# Patient Record
Sex: Male | Born: 1950 | Race: White | Hispanic: No | Marital: Married | Smoking: Never smoker
Health system: Southern US, Community
[De-identification: ages and names within clinical notes are randomized; demographics above are authoritative.]

---

## 2005-08-04 ENCOUNTER — Ambulatory Visit: Payer: Self-pay | Admitting: Family Medicine

## 2005-08-07 ENCOUNTER — Ambulatory Visit: Payer: Self-pay | Admitting: Family Medicine

## 2005-08-31 ENCOUNTER — Ambulatory Visit: Payer: Self-pay | Admitting: Family Medicine

## 2020-05-09 ENCOUNTER — Inpatient Hospital Stay
Admission: EM | Admit: 2020-05-09 | Discharge: 2020-05-11 | DRG: 229 | Disposition: A | Discharge status: Home / Self Care | Payer: Medicare HMO | Attending: Internal Medicine | Admitting: Internal Medicine

## 2020-05-09 ENCOUNTER — Emergency Department: Payer: Medicare HMO

## 2020-05-09 ENCOUNTER — Other Ambulatory Visit: Payer: Self-pay

## 2020-05-09 ENCOUNTER — Encounter: Payer: Self-pay | Admitting: Emergency Medicine

## 2020-05-09 DIAGNOSIS — R55 Syncope and collapse: Secondary | ICD-10-CM | POA: Diagnosis not present

## 2020-05-09 DIAGNOSIS — R001 Bradycardia, unspecified: Secondary | ICD-10-CM | POA: Diagnosis present

## 2020-05-09 DIAGNOSIS — R030 Elevated blood-pressure reading, without diagnosis of hypertension: Secondary | ICD-10-CM | POA: Diagnosis present

## 2020-05-09 DIAGNOSIS — I441 Atrioventricular block, second degree: Secondary | ICD-10-CM | POA: Diagnosis present

## 2020-05-09 DIAGNOSIS — Z20822 Contact with and (suspected) exposure to covid-19: Secondary | ICD-10-CM | POA: Diagnosis present

## 2020-05-09 LAB — HIV ANTIBODY (ROUTINE TESTING W REFLEX): HIV Screen 4th Generation wRfx: NONREACTIVE

## 2020-05-09 LAB — URINALYSIS, COMPLETE (UACMP) WITH MICROSCOPIC
Bacteria, UA: NONE SEEN
Bilirubin Urine: NEGATIVE
Glucose, UA: NEGATIVE mg/dL
Hgb urine dipstick: NEGATIVE
Ketones, ur: NEGATIVE mg/dL
Leukocytes,Ua: NEGATIVE
Nitrite: NEGATIVE
Protein, ur: NEGATIVE mg/dL
Specific Gravity, Urine: 1.009 (ref 1.005–1.030)
Squamous Epithelial / HPF: NONE SEEN (ref 0–5)
pH: 8 (ref 5.0–8.0)

## 2020-05-09 LAB — COMPREHENSIVE METABOLIC PANEL
ALT: 40 U/L (ref 0–44)
AST: 27 U/L (ref 15–41)
Albumin: 3.9 g/dL (ref 3.5–5.0)
Alkaline Phosphatase: 75 U/L (ref 38–126)
Anion gap: 10 (ref 5–15)
BUN: 16 mg/dL (ref 8–23)
CO2: 23 mmol/L (ref 22–32)
Calcium: 9 mg/dL (ref 8.9–10.3)
Chloride: 105 mmol/L (ref 98–111)
Creatinine, Ser: 0.95 mg/dL (ref 0.61–1.24)
GFR, Estimated: >60 mL/min (ref >60)
Glucose, Bld: 174 mg/dL — ABNORMAL HIGH (ref 70–99)
Potassium: 4.3 mmol/L (ref 3.5–5.1)
Sodium: 138 mmol/L (ref 135–145)
Total Bilirubin: 0.9 mg/dL (ref 0.3–1.2)
Total Protein: 7.1 g/dL (ref 6.5–8.1)

## 2020-05-09 LAB — CBC
HCT: 44.6 % (ref 39.0–52.0)
Hemoglobin: 14.9 g/dL (ref 13.0–17.0)
MCH: 31.8 pg (ref 26.0–34.0)
MCHC: 33.4 g/dL (ref 30.0–36.0)
MCV: 95.3 fL (ref 80.0–100.0)
Platelets: 233 10*3/uL (ref 150–400)
RBC: 4.68 MIL/uL (ref 4.22–5.81)
RDW: 12.1 % (ref 11.5–15.5)
WBC: 9.3 10*3/uL (ref 4.0–10.5)
nRBC: 0 % (ref 0.0–0.2)

## 2020-05-09 LAB — PROTIME-INR
INR: 1 (ref 0.8–1.2)
Prothrombin Time: 13.3 seconds (ref 11.4–15.2)

## 2020-05-09 LAB — CBG MONITORING, ED: Glucose-Capillary: 158 mg/dL — ABNORMAL HIGH (ref 70–99)

## 2020-05-09 LAB — APTT: aPTT: 30 seconds (ref 24–36)

## 2020-05-09 LAB — TROPONIN I (HIGH SENSITIVITY)
Troponin I (High Sensitivity): 17 ng/L (ref <18)
Troponin I (High Sensitivity): 16 ng/L (ref <18)

## 2020-05-09 LAB — TSH: TSH: 1.73 u[IU]/mL (ref 0.350–4.500)

## 2020-05-09 MED ORDER — SODIUM CHLORIDE 0.9 % IV SOLN: INTRAVENOUS | Status: DC

## 2020-05-09 MED ORDER — ADULT MULTIVITAMIN W/MINERALS CH
1.0000 | ORAL_TABLET | Freq: Every day | ORAL | Status: DC
  Administered 2020-05-11: 1 via ORAL
  Filled 2020-05-09: qty 1

## 2020-05-09 MED ORDER — CEFAZOLIN SODIUM-DEXTROSE 2-4 GM/100ML-% IV SOLN
2.0000 g | INTRAVENOUS | Status: DC
  Filled 2020-05-09: qty 100

## 2020-05-09 MED ORDER — ONDANSETRON HCL 4 MG/2ML IJ SOLN: 4.0000 mg | Freq: Four times a day (QID) | INTRAMUSCULAR | Status: DC | PRN

## 2020-05-09 MED ORDER — ONDANSETRON HCL 4 MG PO TABS: 4.0000 mg | ORAL_TABLET | Freq: Four times a day (QID) | ORAL | Status: DC | PRN

## 2020-05-09 MED ORDER — SODIUM CHLORIDE 0.9% FLUSH
3.0000 mL | Freq: Two times a day (BID) | INTRAVENOUS | Status: DC
  Administered 2020-05-09 – 2020-05-11 (×3): 3 mL via INTRAVENOUS

## 2020-05-09 MED ORDER — SODIUM CHLORIDE 0.45 % IV SOLN: INTRAVENOUS | Status: DC

## 2020-05-09 MED ORDER — ACETAMINOPHEN 325 MG PO TABS: 650.0000 mg | ORAL_TABLET | Freq: Four times a day (QID) | ORAL | Status: DC | PRN

## 2020-05-09 NOTE — H&P (Signed)
History and Physical    Tanner Howard [id number]:[id number] DOB: 17-Jul-1950 DOA: 05/09/2020  PCP: Pcp, No   Patient coming from: Home  I have personally briefly reviewed patient's old medical records in [facility]  Chief Complaint: " I passed out"  HPI: Tanner Howard is a 70 y.o. male with no significant past medical history presents to the ER for evaluation of a syncopal episode that happened 3 days prior to his admission.  Patient states that he was working in his garden when he suddenly felt very dizzy and lightheaded.  He woke up and found himself on the ground.  He thinks he may have hit his head because he felt some soreness on his scalp for about 24 hours after.  He states that since then he has had intermittent episodes of dizziness and lightheadedness and just walking across the room he feels like he is going to pass out.  His symptoms are associated with shortness of breath but he denies having any chest pain.. Patient states that he has had symptoms intermittently since February of this year. He denies having any orthopnea, no lower extremity swelling, no nausea, no vomiting, no abdominal pain, no changes in his bowel habits, no headache, no blurred vision, no focal deficits, no urinary symptoms, no cough or fever, no chills. Labs show sodium 138, potassium 4.3, chloride 105, bicarb 23, glucose 174, BUN 16, creatinine 0.95, calcium 9.0, alkaline phosphatase 75, albumin 3.9, AST 27, ALT 40, total protein 7.1, troponin 17, white count 9.3, hemoglobin 14.9, hematocrit 44.6, MCV 95.3, RDW 12.1, platelet 233, PT 13.3, INR 1.0 Patient SARS coronavirus 2 point-of-care testing is still pending Chest x-ray reviewed by me shows no acute cardiopulmonary disease. Twelve-lead reviewed by me shows a type II second-degree heart block.    ED Course: Patient is a 70 year old male who presents to the ER for evaluation following a syncopal episode 3 days prior to his admission.  He continues to have  intermittent episodes of dizziness and lightheadedness and is unable to ambulate across the room due to his symptoms.  Twelve-lead EKG shows second-degree heart block.  Patient has been seen by cardiology and is scheduled for pacemaker in a.m.  He will be admitted to the hospital for further evaluation.   Review of Systems: As per HPI otherwise all other systems reviewed and negative.    History reviewed. No pertinent past medical history.  History reviewed. No pertinent surgical history.   reports that he has never smoked. He has never used smokeless tobacco. He reports current alcohol use. He reports previous drug use.  No Known Allergies  Family History  Family history unknown: Yes      Prior to Admission medications   Medication Sig Start Date End Date Taking? Authorizing Provider  acetaminophen (TYLENOL) 325 MG tablet Take 650 mg by mouth every 6 (six) hours as needed.   Yes [provider]  ibuprofen (ADVIL) 200 MG tablet Take 200 mg by mouth every 6 (six) hours as needed.   Yes [provider]  Multiple Vitamin (MULTIVITAMIN WITH MINERALS) TABS tablet Take 1 tablet by mouth daily.   Yes [provider]    Physical Exam: Vitals:   05/09/20 1007 05/09/20 1013 05/09/20 1030 05/09/20 1100  BP: (!) 156/76  (!) 160/62 (!) 148/73  Pulse: (!) 31  (!) 40 (!) 42  Resp: 18  16 20   Temp:  98.3 F (36.8 C)    TempSrc:  Oral    SpO2: 100%  100% 98%  Weight:      Height:         Vitals:   05/09/20 1007 05/09/20 1013 05/09/20 1030 05/09/20 1100  BP: (!) 156/76  (!) 160/62 (!) 148/73  Pulse: (!) 31  (!) 40 (!) 42  Resp: 18  16 20   Temp:  98.3 F (36.8 C)    TempSrc:  Oral    SpO2: 100%  100% 98%  Weight:      Height:          Constitutional: Alert and oriented x 3 . Not in any apparent distress HEENT:      Head: Normocephalic and atraumatic.         Eyes: PERLA, EOMI, Conjunctivae are normal. Sclera is non-icteric.       Mouth/Throat:  Mucous membranes are moist.       Neck: Supple with no signs of meningismus. Cardiovascular:  Bradycardic. No murmurs, gallops, or rubs. 2+ symmetrical distal pulses are present . No JVD. No LE edema Respiratory: Respiratory effort normal .Lungs sounds clear bilaterally. No wheezes, crackles, or rhonchi.  Gastrointestinal: Soft, non tender, and non distended with positive bowel sounds.  Genitourinary: No CVA tenderness. Musculoskeletal: Nontender with normal range of motion in all extremities. No cyanosis, or erythema of extremities. Neurologic:  Face is symmetric. Moving all extremities. No gross focal neurologic deficits . Skin: Skin is warm, dry.  No rash or ulcers Psychiatric: Mood and affect are normal   Labs on Admission: I have personally reviewed following labs and imaging studies  CBC: Recent Labs  Lab 05/09/20 1009  WBC 9.3  HGB 14.9  HCT 44.6  MCV 95.3  PLT 233   Basic Metabolic Panel: Recent Labs  Lab 05/09/20 1009  NA 138  K 4.3  CL 105  CO2 23  GLUCOSE 174*  BUN 16  CREATININE 0.95  CALCIUM 9.0   GFR: Estimated Creatinine Clearance: 77.9 mL/min (by C-G formula based on SCr of 0.95 mg/dL). Liver Function Tests: Recent Labs  Lab 05/09/20 1009  AST 27  ALT 40  ALKPHOS 75  BILITOT 0.9  PROT 7.1  ALBUMIN 3.9   No results for input(s): LIPASE, AMYLASE in the last 168 hours. No results for input(s): AMMONIA in the last 168 hours. Coagulation Profile: Recent Labs  Lab 05/09/20 1009  INR 1.0   Cardiac Enzymes: No results for input(s): CKTOTAL, CKMB, CKMBINDEX, TROPONINI in the last 168 hours. BNP (last 3 results) No results for input(s): PROBNP in the last 8760 hours. HbA1C: No results for input(s): HGBA1C in the last 72 hours. CBG: Recent Labs  Lab 05/09/20 1020  GLUCAP 158*   Lipid Profile: No results for input(s): CHOL, HDL, LDLCALC, TRIG, CHOLHDL, LDLDIRECT in the last 72 hours. Thyroid Function Tests: No results for input(s): TSH,  T4TOTAL, FREET4, T3FREE, THYROIDAB in the last 72 hours. Anemia Panel: No results for input(s): VITAMINB12, FOLATE, FERRITIN, TIBC, IRON, RETICCTPCT in the last 72 hours. Urine analysis: No results found for: COLORURINE, APPEARANCEUR, LABSPEC, PHURINE, GLUCOSEU, HGBUR, BILIRUBINUR, KETONESUR, PROTEINUR, UROBILINOGEN, NITRITE, LEUKOCYTESUR  Radiological Exams on Admission: DG Chest Port 1 View  Result Date: 05/09/2020 CLINICAL DATA:  Weakness, syncopal episode. EXAM: PORTABLE CHEST 1 VIEW COMPARISON:  None. FINDINGS: Borderline cardiomegaly. Pacer pad overlies the RIGHT chest coarse lung markings bilaterally. No pleural effusion or pneumothorax is seen. Osseous structures about the chest are unremarkable. IMPRESSION: 1. No active disease. No evidence of pneumonia or pulmonary edema. 2. Probable chronic interstitial lung disease and/or chronic  bronchitic change. 3. Borderline cardiomegaly. Electronically Signed   By: Bary Richard M.D.   On: 05/09/2020 11:06     Assessment/Plan Active Problems:   Symptomatic bradycardia   Syncope and collapse    Symptomatic bradycardia Patient presents to the ER for evaluation following a syncopal episode 3 days prior to his admission as well as intermittent episodes of dizziness and lightheadedness. Patient noted to be bradycardic with heart rate in the 30s and twelve-lead EKG shows a type II second-degree AV block Appreciate cardiology consult, plan is for pacemaker insertion in a.m. Obtain TSH levels Obtain 2D echocardiogram to assess  DVT prophylaxis: SCD Code Status: full code Family Communication: Greater than 50% spent discussing patient's condition and plan of care with him and his son at the bedside.  He lists his son and wife as his healthcare power of attorney and also states that he is a Jehovah witness and will not receive any blood products which include red blood cells, platelets, plasma.  CODE STATUS was discussed and he is a full  code. Disposition Plan: Back to previous home environment Consults called: Cardiology Status: At the time of admission, it appears that the appropriate admission status for this patient is inpatient. This is judged to be reasonable and necessary in order to provide the required intensity of service to ensure the patient's safety given the presenting symptoms, physical exam findings, and initial radiographic and laboratory data in the context of the comorbid conditions. Patient requires inpatient status due to high intensity of service, high risk for further deterioration and high frequency of surveillance required    Dinorah Masullo MD [facility]     05/09/2020, 12:00 PM

## 2020-05-09 NOTE — ED Notes (Signed)
X-ray at bedside

## 2020-05-09 NOTE — ED Provider Notes (Signed)
[facility] Emergency Department Provider Note   ____________________________________________    I have reviewed the triage vital signs and the nursing notes.   HISTORY  Chief Complaint     HPI Tanner Howard is a 70 y.o. male who presents after a syncopal episode several days ago and lightheadedness since then.  Patient reports on Thursday he was working in his garden, he reports he passed out.  Since then he feels lightheaded even when "just walking across the room ".  He denies chest pain.  No significant shortness of breath.  Feels well at rest.  He has never had this before.  No new medications.  Does not take any medications besides vitamins.  History reviewed. No pertinent past medical history.  There are no problems to display for this patient.   History reviewed. No pertinent surgical history.  Prior to Admission medications   Not on File     Allergies Patient has no known allergies.  No family history on file.  Social History Social History   Tobacco Use  . Smoking status: Never Smoker  . Smokeless tobacco: Never Used  Substance Use Topics  . Alcohol use: Yes    Comment: social  . Drug use: Not Currently    Review of Systems  Constitutional: No fever/chills Eyes: No visual changes.  ENT: No sore throat. Cardiovascular: As above Respiratory: Denies shortness of breath. Gastrointestinal: No abdominal pain.  No nausea, no vomiting.   Genitourinary: Negative for dysuria. Musculoskeletal: Negative for back pain. Skin: Negative for rash. Neurological: Negative for headaches or weakness   ____________________________________________   PHYSICAL EXAM:  VITAL SIGNS: ED Triage Vitals  Enc Vitals Group     BP 05/09/20 1007 (!) 156/76     Pulse Rate 05/09/20 1007 (!) 31     Resp 05/09/20 1007 18     Temp 05/09/20 1013 98.3 F (36.8 C)     Temp Source 05/09/20 1013 Oral     SpO2 05/09/20 1007 100 %     Weight 05/09/20  1002 79.4 kg (175 lb)     Height 05/09/20 1002 1.778 m (5\' 10" )     Head Circumference --      Peak Flow --      Pain Score 05/09/20 1002 0     Pain Loc --      Pain Edu? --      Excl. in GC? --     Constitutional: Alert and oriented.   Nose: No congestion/rhinnorhea. Mouth/Throat: Mucous membranes are moist.    Cardiovascular: Bradycardia, regular rhythm. Grossly normal heart sounds.  Good peripheral circulation. Respiratory: Normal respiratory effort.  No retractions. Lungs CTAB. Gastrointestinal: Soft and nontender. No distention.  No CVA tenderness.  Musculoskeletal: No lower extremity tenderness nor edema.  Warm and well perfused Neurologic:  Normal speech and language. No gross focal neurologic deficits are appreciated.  Skin:  Skin is warm, dry and intact. No rash noted. Psychiatric: Mood and affect are normal. Speech and behavior are normal.  ____________________________________________   LABS (all labs ordered are listed, but only abnormal results are displayed)  Labs Reviewed  COMPREHENSIVE METABOLIC PANEL - Abnormal; Notable for the following components:      Result Value   Glucose, Bld 174 (*)    All other components within normal limits  CBG MONITORING, ED - Abnormal; Notable for the following components:   Glucose-Capillary 158 (*)    All other components within normal limits  SARS CORONAVIRUS 2 (  TAT 6-24 HRS)  CBC  APTT  PROTIME-INR  URINALYSIS, COMPLETE (UACMP) WITH MICROSCOPIC  TROPONIN I (HIGH SENSITIVITY)   ____________________________________________  EKG  ED ECG REPORT I, Jene Every, the attending physician, personally viewed and interpreted this ECG.  Date: 05/09/2020  Rate: 30 Rhythm: A-V dissociation QRS Axis: normal Intervals: Abnormal ST/T Wave abnormalities: Mild ST depression Narrative Interpretation: Third-degree AV block  ____________________________________________  RADIOLOGY  Chest x-ray viewed by me, no acute  abnormality ____________________________________________   PROCEDURES  Procedure(s) performed: No  Procedures   Critical Care performed: yes  CRITICAL CARE Performed by: Jene Every   Total critical care time: 30 minutes  Critical care time was exclusive of separately billable procedures and treating other patients.  Critical care was necessary to treat or prevent imminent or life-threatening deterioration.  Critical care was time spent personally by me on the following activities: development of treatment plan with patient and/or surrogate as well as nursing, discussions with consultants, evaluation of patient's response to treatment, examination of patient, obtaining history from patient or surrogate, ordering and performing treatments and interventions, ordering and review of laboratory studies, ordering and review of radiographic studies, pulse oximetry and re-evaluation of patient's condition.  ____________________________________________   INITIAL IMPRESSION / ASSESSMENT AND PLAN / ED COURSE  Pertinent labs & imaging results that were available during my care of the patient were reviewed by me and considered in my medical decision making (see chart for details).  Patient presents with lightheadedness for several days after a syncopal episode.  Noted to be significantly bradycardic on exam, heart rate in the 30s, EKG confirms third-degree AV block.  Blood pressure is reassuring.  Patient is not on beta blocking agents or calcium channel blockers  Discussed with Dr. Darrold Junker of cardiology, he will see the patient to determine temporary wire/pacemaker needs.  CBC is normal PTT INR normal, pending troponin.  Will admit to the hospitalist team    ____________________________________________   FINAL CLINICAL IMPRESSION(S) / ED DIAGNOSES  Final diagnoses:  Second degree heart block  Bradycardia        Note:  This document was prepared using Dragon voice  recognition software and may include unintentional dictation errors.   Jene Every, MD 05/09/20 1104

## 2020-05-09 NOTE — ED Notes (Signed)
EDP Kinner called to bedside to evaluate pt due to heart rate. Zoll pads placed on pt.

## 2020-05-09 NOTE — ED Triage Notes (Signed)
Pt to ED via POV c/o syncopal episode on Thursday and shortness of breath. Pt states that since then he has been feeling lightheaded and like he can't catch his breath. Pt is able to speak in complete sentences at this time and is in NAD.

## 2020-05-09 NOTE — Consult Note (Signed)
[facility] Cardiology  CARDIOLOGY CONSULT NOTE  Patient ID: Tanner Howard MRN: [id number] DOB/AGE: October 26, 1950 71 y.o.  Admit date: 05/09/2020 Referring Physician [facility] Primary Physician  Primary Cardiologist  Reason for Consultation Mobitz 2 second-degree AV block  HPI: 70 year old gentleman referred for syncope with bradycardia secondary to Mobitz 2 second-degree AV block.  The patient reports 02/2020 he experienced episodes of lightheadedness which lasted 1 to 2 weeks.  He was in his usual state of health until 05/07/2020 when he experienced brief loss of consciousness while he was tilling in the garden.  The patient reports he did not seek medical attention.  He remained at home, relaxed, sitting on the couch without further activity or presyncopal symptoms.  On 05/08/2020 he resumed his usual activity, cooking steaks on the grill without presyncope or syncope, but did experience torsional dyspnea without chest pain.  Symptoms persisted, and he presented to [facility] ED earlier today where he was noted to be bradycardic.  ECG and telemetry revealed intermittent breaks to second-degree AV block with heart rates in the 40s.  Patient remains hemodynamically stable.  Admission labs were unremarkable.  High-sensitivity troponin was borderline elevated to 17 in the absence of chest pain.  ECG reveals 2-1 AV conduction with right bundle branch block, with intermittent blocked PACs, and sinus rhythm at a rate of 88 bpm.  Review of systems complete and found to be negative unless listed above     History reviewed. No pertinent past medical history.  History reviewed. No pertinent surgical history.  (Not in a hospital admission)  Social History   Socioeconomic History  . Marital status: Married    Spouse name: Not on file  . Number of children: Not on file  . Years of education: Not on file  . Highest education level: Not on file  Occupational History  . Not on file  Tobacco Use  . Smoking status: Never  Smoker  . Smokeless tobacco: Never Used  Substance and Sexual Activity  . Alcohol use: Yes    Comment: social  . Drug use: Not Currently  . Sexual activity: Not on file  Other Topics Concern  . Not on file  Social History Narrative  . Not on file   Social Determinants of Health   Financial Resource Strain: Not on file  Food Insecurity: Not on file  Transportation Needs: Not on file  Physical Activity: Not on file  Stress: Not on file  Social Connections: Not on file  Intimate Partner Violence: Not on file    No family history on file.    Review of systems complete and found to be negative unless listed above      PHYSICAL EXAM  General: Well developed, well nourished, in no acute distress HEENT:  Normocephalic and atramatic Neck:  No JVD.  Lungs: Clear bilaterally to auscultation and percussion. Heart: HRRR . Normal S1 and S2 without gallops or murmurs.  Abdomen: Bowel sounds are positive, abdomen soft and non-tender  Msk:  Back normal, normal gait. Normal strength and tone for age. Extremities: No clubbing, cyanosis or edema.   Neuro: Alert and oriented X 3. Psych:  Good affect, responds appropriately  Labs:   Lab Results  Component Value Date   WBC 9.3 05/09/2020   HGB 14.9 05/09/2020   HCT 44.6 05/09/2020   MCV 95.3 05/09/2020   PLT 233 05/09/2020    Recent Labs  Lab 05/09/20 1009  NA 138  K 4.3  CL 105  CO2 23  BUN 16  CREATININE 0.95  CALCIUM 9.0  PROT 7.1  BILITOT 0.9  ALKPHOS 75  ALT 40  AST 27  GLUCOSE 174*   No results found for: CKTOTAL, CKMB, CKMBINDEX, TROPONINI No results found for: CHOL No results found for: HDL No results found for: LDLCALC No results found for: TRIG No results found for: CHOLHDL No results found for: LDLDIRECT    Radiology: [facility] Chest Port 1 View  Result Date: 05/09/2020 CLINICAL DATA:  Weakness, syncopal episode. EXAM: PORTABLE CHEST 1 VIEW COMPARISON:  None. FINDINGS: Borderline cardiomegaly. Pacer pad  overlies the RIGHT chest coarse lung markings bilaterally. No pleural effusion or pneumothorax is seen. Osseous structures about the chest are unremarkable. IMPRESSION: 1. No active disease. No evidence of pneumonia or pulmonary edema. 2. Probable chronic interstitial lung disease and/or chronic bronchitic change. 3. Borderline cardiomegaly. Electronically Signed   By: Bary Richard M.D.   On: 05/09/2020 11:06    EKG: Mobitz 2 second-degree AV block  ASSESSMENT AND PLAN:   1.  Syncope, secondary to intermittent Mobitz 2 second-degree AV block, clinically and hemodynamically stable  Recommendations  1.  Defer temporary transvenous pacemaker at this time 2.  Proceed with Micra AV leadless pacemaker in a.m.  We discussed the risk, benefits and alternatives of traditional dual-chamber pacemaker versus Micra AV leadless pacemaker, and the patient and patient's son agree with proceeding with Micra AV leadless pacemaker scheduled for the a.m.  Signed: Marcina Millard MD,PhD, [facility] 05/09/2020, 11:35 AM

## 2020-05-10 ENCOUNTER — Inpatient Hospital Stay
Admit: 2020-05-10 | Discharge: 2020-05-10 | Disposition: A | Discharge status: Home / Self Care | Payer: Medicare HMO | Attending: Internal Medicine | Admitting: Internal Medicine

## 2020-05-10 ENCOUNTER — Encounter: Payer: Self-pay | Admitting: Internal Medicine

## 2020-05-10 ENCOUNTER — Encounter
Admission: EM | Disposition: A | Discharge status: Home / Self Care | Payer: Self-pay | Source: Home / Self Care | Attending: Internal Medicine

## 2020-05-10 DIAGNOSIS — R030 Elevated blood-pressure reading, without diagnosis of hypertension: Secondary | ICD-10-CM

## 2020-05-10 DIAGNOSIS — I441 Atrioventricular block, second degree: Principal | ICD-10-CM

## 2020-05-10 HISTORY — PX: PACEMAKER LEADLESS INSERTION: EP1219

## 2020-05-10 LAB — BASIC METABOLIC PANEL
Anion gap: 9 (ref 5–15)
BUN: 13 mg/dL (ref 8–23)
CO2: 25 mmol/L (ref 22–32)
Calcium: 8.8 mg/dL — ABNORMAL LOW (ref 8.9–10.3)
Chloride: 104 mmol/L (ref 98–111)
Creatinine, Ser: 0.89 mg/dL (ref 0.61–1.24)
GFR, Estimated: >60 mL/min (ref >60)
Glucose, Bld: 120 mg/dL — ABNORMAL HIGH (ref 70–99)
Potassium: 4 mmol/L (ref 3.5–5.1)
Sodium: 138 mmol/L (ref 135–145)

## 2020-05-10 LAB — CBC
HCT: 42.6 % (ref 39.0–52.0)
Hemoglobin: 14.8 g/dL (ref 13.0–17.0)
MCH: 32.4 pg (ref 26.0–34.0)
MCHC: 34.7 g/dL (ref 30.0–36.0)
MCV: 93.2 fL (ref 80.0–100.0)
Platelets: 228 10*3/uL (ref 150–400)
RBC: 4.57 MIL/uL (ref 4.22–5.81)
RDW: 11.9 % (ref 11.5–15.5)
WBC: 5.3 10*3/uL (ref 4.0–10.5)
nRBC: 0 % (ref 0.0–0.2)

## 2020-05-10 LAB — SURGICAL PCR SCREEN
MRSA, PCR: NEGATIVE
Staphylococcus aureus: POSITIVE — AB

## 2020-05-10 LAB — SARS CORONAVIRUS 2 (TAT 6-24 HRS): SARS Coronavirus 2: NEGATIVE

## 2020-05-10 SURGERY — PACEMAKER LEADLESS INSERTION
Anesthesia: Moderate Sedation

## 2020-05-10 MED ORDER — ACETAMINOPHEN 325 MG PO TABS: 650.0000 mg | ORAL_TABLET | ORAL | Status: DC | PRN

## 2020-05-10 MED ORDER — HEPARIN (PORCINE) IN NACL 1000-0.9 UT/500ML-% IV SOLN
INTRAVENOUS | Status: AC
  Filled 2020-05-10: qty 1000

## 2020-05-10 MED ORDER — FENTANYL CITRATE (PF) 100 MCG/2ML IJ SOLN
INTRAMUSCULAR | Status: DC | PRN
  Administered 2020-05-10: 50 ug via INTRAVENOUS

## 2020-05-10 MED ORDER — HEPARIN (PORCINE) IN NACL 2000-0.9 UNIT/L-% IV SOLN
INTRAVENOUS | Status: DC | PRN
  Administered 2020-05-10: 1000 mL

## 2020-05-10 MED ORDER — HEPARIN SODIUM (PORCINE) 1000 UNIT/ML IJ SOLN
INTRAMUSCULAR | Status: DC | PRN
  Administered 2020-05-10: 4000 [IU] via INTRAVENOUS

## 2020-05-10 MED ORDER — HYDRALAZINE HCL 20 MG/ML IJ SOLN
10.0000 mg | Freq: Four times a day (QID) | INTRAMUSCULAR | Status: DC | PRN
Start: 2020-05-10 — End: 2020-05-11

## 2020-05-10 MED ORDER — SODIUM CHLORIDE 0.9% FLUSH: 3.0000 mL | INTRAVENOUS | Status: DC | PRN

## 2020-05-10 MED ORDER — MIDAZOLAM HCL 2 MG/2ML IJ SOLN
INTRAMUSCULAR | Status: AC
  Filled 2020-05-10: qty 2

## 2020-05-10 MED ORDER — SODIUM CHLORIDE 0.9 % IV SOLN: 250.0000 mL | INTRAVENOUS | Status: DC | PRN

## 2020-05-10 MED ORDER — CHLORHEXIDINE GLUCONATE CLOTH 2 % EX PADS
6.0000 | MEDICATED_PAD | Freq: Every day | CUTANEOUS | Status: DC
  Administered 2020-05-10: 6 via TOPICAL

## 2020-05-10 MED ORDER — ONDANSETRON HCL 4 MG/2ML IJ SOLN: 4.0000 mg | Freq: Four times a day (QID) | INTRAMUSCULAR | Status: DC | PRN

## 2020-05-10 MED ORDER — HEPARIN SODIUM (PORCINE) 1000 UNIT/ML IJ SOLN
INTRAMUSCULAR | Status: AC
  Filled 2020-05-10: qty 1

## 2020-05-10 MED ORDER — SODIUM CHLORIDE 0.9% FLUSH
3.0000 mL | Freq: Two times a day (BID) | INTRAVENOUS | Status: DC
  Administered 2020-05-10 – 2020-05-11 (×2): 3 mL via INTRAVENOUS

## 2020-05-10 MED ORDER — MUPIROCIN 2 % EX OINT
1.0000 "application " | TOPICAL_OINTMENT | Freq: Two times a day (BID) | CUTANEOUS | Status: DC
  Administered 2020-05-10 – 2020-05-11 (×2): 1 via NASAL
  Filled 2020-05-10: qty 22

## 2020-05-10 MED ORDER — MIDAZOLAM HCL 2 MG/2ML IJ SOLN
INTRAMUSCULAR | Status: DC | PRN
  Administered 2020-05-10: 1 mg via INTRAVENOUS

## 2020-05-10 MED ORDER — IOHEXOL 300 MG/ML  SOLN
INTRAMUSCULAR | Status: DC | PRN
  Administered 2020-05-10: 30 mL

## 2020-05-10 MED ORDER — FENTANYL CITRATE (PF) 100 MCG/2ML IJ SOLN
INTRAMUSCULAR | Status: AC
  Filled 2020-05-10: qty 2

## 2020-05-10 MED ORDER — CEFAZOLIN SODIUM-DEXTROSE 2-4 GM/100ML-% IV SOLN
INTRAVENOUS | Status: AC
  Filled 2020-05-10: qty 100

## 2020-05-10 SURGICAL SUPPLY — 16 items
DILATOR VESSEL 38 20CM 12FR (INTRODUCER) ×1 IMPLANT
DILATOR VESSEL 38 20CM 14FR (INTRODUCER) ×1 IMPLANT
DILATOR VESSEL 38 20CM 18FR (INTRODUCER) ×1 IMPLANT
DILATOR VESSEL 38 20CM 8FR (INTRODUCER) ×1 IMPLANT
MICRA AV TRANSCATH PACING SYS (Pacemaker) ×2 IMPLANT
MICRA INTRODUCER SHEATH (SHEATH) ×2
NDL PERC 18GX7CM (NEEDLE) IMPLANT
NEEDLE PERC 18GX7CM (NEEDLE) ×2 IMPLANT
PACK CARDIAC CATH (CUSTOM PROCEDURE TRAY) ×2 IMPLANT
PAD ELECT DEFIB RADIOL ZOLL (MISCELLANEOUS) ×1 IMPLANT
SHEATH AVANTI 7FRX11 (SHEATH) ×2 IMPLANT
SHEATH INTRODUCER MICRA (SHEATH) IMPLANT
SUT SILK 0 SH 30 (SUTURE) ×1 IMPLANT
SYSTEM PACING TRNSCTH AV MICRA (Pacemaker) IMPLANT
WIRE AMPLATZ SS-J .035X180CM (WIRE) ×2 IMPLANT
WIRE GUIDERIGHT .035X150 (WIRE) ×1 IMPLANT

## 2020-05-10 NOTE — Plan of Care (Signed)

## 2020-05-10 NOTE — Progress Notes (Signed)
PROGRESS NOTE    Tanner Howard  CHE:[id number] DOB: April 26, 1950 DOA: 05/09/2020 PCP: Pcp, No (Confirm with patient/family/NH records and if not entered, this HAS to be entered at [facility] point of entry. "No PCP" if truly none.)   Chief Complaint  Patient presents with  . Shortness of Breath  . Loss of Consciousness    Brief Narrative: (Start on day 1 of progress note - keep it brief and live) Patient is 70 year old gentleman presented to the ED with syncope with bradycardia secondary to Mobitz 2 second-degree AV block.  Patient was seen in consultation by cardiology and patient status post Micra AV leadless pacemaker placement 05/10/2020 per cardiology.   Assessment & Plan:   Principal Problem:   Symptomatic bradycardia Active Problems:   Syncope and collapse   Elevated blood pressure reading  1 symptomatic bradycardia/syncope secondary to Mobitz 2 second-degree AV block -Patient presented with syncopal episode dizziness and lightheadedness. -Patient seen in consultation by cardiology and underwent Micra AV leadless pacemaker placement per Dr. Darrold Junker today. -2D echo pending. -Per cardiology.  2.  Elevated blood pressure -Patient with no history of hypertension. -Not on antihypertensive medications. -Likely secondary to problem #1. -Hydralazine as needed.    DVT prophylaxis: SCDs Code Status: Full Family Communication: Updated patient and wife at bedside. Disposition:   Status is: Inpatient    Dispo: The patient is from: Home              Anticipated d/c is to: Home              Patient currently status post pacemaker placement.  Not stable for the discharge.   Difficult to place patient no       Consultants:   Cardiology: Dr.Paraschos 05/09/2020  Procedures:   Chest x-ray 05/09/2020  2D echo 05/10/2020  Status post Micra AV leadless pacemaker per Dr. Darrold Junker 05/10/2020    Antimicrobials:   None   Subjective: Laying flat in bed just returning from  pacemaker placement.  Denies chest pain.  No shortness of breath.  Wife at bedside.  Objective: Vitals:   05/10/20 0915 05/10/20 0930 05/10/20 1102 05/10/20 1513  BP: 137/86 (!) 142/81 (!) 149/95 137/79  Pulse: 61 63 67 83  Resp: 15 16 18 18   Temp:   98.3 F (36.8 C) 98.1 F (36.7 C)  TempSrc:   Oral   SpO2: 95% 94% 98% 94%  Weight:      Height:        Intake/Output Summary (Last 24 hours) at 05/10/2020 1748 Last data filed at 05/10/2020 1358 Gross per 24 hour  Intake 360 ml  Output --  Net 360 ml   Filed Weights   05/09/20 1504 05/10/20 0505 05/10/20 0708  Weight: 77.8 kg 78.9 kg 79.4 kg    Examination:  General exam: Appears calm and comfortable  Respiratory system: Clear to auscultation anterior lung fields. Respiratory effort normal. Cardiovascular system: S1 & S2 heard, RRR. No JVD, murmurs, rubs, gallops or clicks. No pedal edema. Gastrointestinal system: Abdomen is nondistended, soft and nontender. No organomegaly or masses felt. Normal bowel sounds heard. Central nervous system: Alert and oriented. No focal neurological deficits. Extremities: Symmetric 5 x 5 power. Skin: No rashes, lesions or ulcers Psychiatry: Judgement and insight appear normal. Mood & affect appropriate.     Data Reviewed: I have personally reviewed following labs and imaging studies  CBC: Recent Labs  Lab 05/09/20 1009 05/10/20 0510  WBC 9.3 5.3  HGB 14.9 14.8  HCT 44.6  42.6  MCV 95.3 93.2  PLT 233 228    Basic Metabolic Panel: Recent Labs  Lab 05/09/20 1009 05/10/20 0510  NA 138 138  K 4.3 4.0  CL 105 104  CO2 23 25  GLUCOSE 174* 120*  BUN 16 13  CREATININE 0.95 0.89  CALCIUM 9.0 8.8*    GFR: Estimated Creatinine Clearance: 83.2 mL/min (by C-G formula based on SCr of 0.89 mg/dL).  Liver Function Tests: Recent Labs  Lab 05/09/20 1009  AST 27  ALT 40  ALKPHOS 75  BILITOT 0.9  PROT 7.1  ALBUMIN 3.9    CBG: Recent Labs  Lab 05/09/20 1020  GLUCAP 158*      Recent Results (from the past 240 hour(s))  SARS CORONAVIRUS 2 (TAT 6-24 HRS) Nasopharyngeal Nasopharyngeal Swab     Status: None   Collection Time: 05/09/20 10:21 AM   Specimen: Nasopharyngeal Swab  Result Value Ref Range Status   SARS Coronavirus 2 NEGATIVE NEGATIVE Final    Comment: (NOTE) SARS-CoV-2 target nucleic acids are NOT DETECTED.  The SARS-CoV-2 RNA is generally detectable in upper and lower respiratory specimens during the acute phase of infection. Negative results do not preclude SARS-CoV-2 infection, do not rule out co-infections with other pathogens, and should not be used as the sole basis for treatment or other patient management decisions. Negative results must be combined with clinical observations, patient history, and epidemiological information. The expected result is Negative.  Fact Sheet for Patients: HairSlick.no  Fact Sheet for Healthcare Providers: quierodirigir.com  This test is not yet approved or cleared by the [place] FDA and  has been authorized for detection and/or diagnosis of SARS-CoV-2 by FDA under an Emergency Use Authorization (EUA). This EUA will remain  in effect (meaning this test can be used) for the duration of the COVID-19 declaration under Se ction 564(b)(1) of the Act, 21 U.S.C. section 360bbb-3(b)(1), unless the authorization is terminated or revoked sooner.  Performed at [facility], [address] [place], [place] [address]   Surgical PCR screen     Status: Abnormal   Collection Time: 05/09/20  7:32 PM   Specimen: Nasal Mucosa; Nasal Swab  Result Value Ref Range Status   MRSA, PCR NEGATIVE NEGATIVE Final   Staphylococcus aureus POSITIVE (A) NEGATIVE Final    Comment: (NOTE) The Xpert SA Assay (FDA approved for NASAL specimens in patients 38 years of age and older), is one component of a comprehensive surveillance program. It is not intended to  diagnose infection nor to guide or monitor treatment. Performed at [facility], [address], [place], [place] [address]          Radiology Studies: EP PPM/ICD IMPLANT  Result Date: 05/10/2020 Successful Micra AV leadless pacemaker implantation  DG Chest Port 1 View  Result Date: 05/09/2020 CLINICAL DATA:  Weakness, syncopal episode. EXAM: PORTABLE CHEST 1 VIEW COMPARISON:  None. FINDINGS: Borderline cardiomegaly. Pacer pad overlies the RIGHT chest coarse lung markings bilaterally. No pleural effusion or pneumothorax is seen. Osseous structures about the chest are unremarkable. IMPRESSION: 1. No active disease. No evidence of pneumonia or pulmonary edema. 2. Probable chronic interstitial lung disease and/or chronic bronchitic change. 3. Borderline cardiomegaly. Electronically Signed   By: Bary Richard M.D.   On: 05/09/2020 11:06        Scheduled Meds: . Chlorhexidine Gluconate Cloth  6 each Topical Q0600  . multivitamin with minerals  1 tablet Oral Daily  . mupirocin ointment  1 application Nasal  BID  . sodium chloride flush  3 mL Intravenous Q12H  . sodium chloride flush  3 mL Intravenous Q12H   Continuous Infusions: . sodium chloride       LOS: 1 day    Time spent: 40 minutes    Ramiro Harvest, MD [facility]   To contact the attending provider between 7A-7P or the covering provider during after hours 7P-7A, please log into the web site [email or web address] and access using universal [place] password for that web site. If you do not have the password, please call the hospital operator.  05/10/2020, 5:48 PM

## 2020-05-10 NOTE — Progress Notes (Signed)
*  PRELIMINARY RESULTS* Echocardiogram 2D Echocardiogram has been performed.  Joanette Gula Shanna Strength 05/10/2020, 12:41 PM

## 2020-05-11 LAB — BASIC METABOLIC PANEL
Anion gap: 9 (ref 5–15)
BUN: 15 mg/dL (ref 8–23)
CO2: 24 mmol/L (ref 22–32)
Calcium: 8.8 mg/dL — ABNORMAL LOW (ref 8.9–10.3)
Chloride: 104 mmol/L (ref 98–111)
Creatinine, Ser: 0.89 mg/dL (ref 0.61–1.24)
GFR, Estimated: >60 mL/min (ref >60)
Glucose, Bld: 110 mg/dL — ABNORMAL HIGH (ref 70–99)
Potassium: 4.4 mmol/L (ref 3.5–5.1)
Sodium: 137 mmol/L (ref 135–145)

## 2020-05-11 LAB — CBC
HCT: 43.7 % (ref 39.0–52.0)
Hemoglobin: 15 g/dL (ref 13.0–17.0)
MCH: 31.9 pg (ref 26.0–34.0)
MCHC: 34.3 g/dL (ref 30.0–36.0)
MCV: 93 fL (ref 80.0–100.0)
Platelets: 221 10*3/uL (ref 150–400)
RBC: 4.7 MIL/uL (ref 4.22–5.81)
RDW: 11.9 % (ref 11.5–15.5)
WBC: 5.8 10*3/uL (ref 4.0–10.5)
nRBC: 0 % (ref 0.0–0.2)

## 2020-05-11 LAB — ECHOCARDIOGRAM COMPLETE
AR max vel: 2.79 cm2
AV Area VTI: 3.11 cm2
AV Area mean vel: 2.69 cm2
AV Mean grad: 3 mmHg
AV Peak grad: 5.4 mmHg
Ao pk vel: 1.16 m/s
Area-P 1/2: 2.36 cm2
Height: 70 in
MV VTI: 2.59 cm2
S' Lateral: 2.6 cm
Weight: 2800 oz

## 2020-05-11 LAB — GLUCOSE, CAPILLARY: Glucose-Capillary: 141 mg/dL — ABNORMAL HIGH (ref 70–99)

## 2020-05-11 LAB — MAGNESIUM: Magnesium: 2 mg/dL (ref 1.7–2.4)

## 2020-05-11 NOTE — Progress Notes (Signed)
[facility] Cardiology    SUBJECTIVE: The patient reports feeling well. He ambulated yesterday after the procedure without difficulty. He denies tenderness or drainage from the insertion site.   Vitals:   05/10/20 1513 05/10/20 1930 05/11/20 0305 05/11/20 0754  BP: 137/79 140/87 (!) 143/98 117/87  Pulse: 83 91 80 80  Resp: 18 18 18    Temp: 98.1 F (36.7 C) 98.8 F (37.1 C) 98.4 F (36.9 C) 98.2 F (36.8 C)  TempSrc:  Oral Axillary   SpO2: 94% 96% 94% 96%  Weight:   78.1 kg   Height:         Intake/Output Summary (Last 24 hours) at 05/11/2020 0847 Last data filed at 05/10/2020 1841 Gross per 24 hour  Intake 480 ml  Output --  Net 480 ml      PHYSICAL EXAM  General: Well developed, well nourished, in no acute distress HEENT:  Normocephalic and atramatic Neck:  No JVD.  Lungs: Clear bilaterally to auscultation and percussion. Heart: HRRR . Normal S1 and S2 without gallops or murmurs.  Msk:   Normal strength and tone for age. Extremities: No clubbing, cyanosis or edema.   Neuro: Alert and oriented X 3. Right upper groin: suture removed. No hematoma, edema, erythema, or warmth. Small blister filled with serous fluid Psych:  Good affect, responds appropriately   LABS: Basic Metabolic Panel: Recent Labs    05/10/20 0510 05/11/20 0554  NA 138 137  K 4.0 4.4  CL 104 104  CO2 25 24  GLUCOSE 120* 110*  BUN 13 15  CREATININE 0.89 0.89  CALCIUM 8.8* 8.8*  MG  --  2.0   Liver Function Tests: Recent Labs    05/09/20 1009  AST 27  ALT 40  ALKPHOS 75  BILITOT 0.9  PROT 7.1  ALBUMIN 3.9   No results for input(s): LIPASE, AMYLASE in the last 72 hours. CBC: Recent Labs    05/10/20 0510 05/11/20 0554  WBC 5.3 5.8  HGB 14.8 15.0  HCT 42.6 43.7  MCV 93.2 93.0  PLT 228 221   Cardiac Enzymes: No results for input(s): CKTOTAL, CKMB, CKMBINDEX, TROPONINI in the last 72 hours. BNP: Invalid input(s): POCBNP D-Dimer: No results for input(s): DDIMER in the last 72  hours. Hemoglobin A1C: No results for input(s): HGBA1C in the last 72 hours. Fasting Lipid Panel: No results for input(s): CHOL, HDL, LDLCALC, TRIG, CHOLHDL, LDLDIRECT in the last 72 hours. Thyroid Function Tests: Recent Labs    05/09/20 1222  TSH 1.730   Anemia Panel: No results for input(s): VITAMINB12, FOLATE, FERRITIN, TIBC, IRON, RETICCTPCT in the last 72 hours.  EP PPM/ICD IMPLANT  Result Date: 05/10/2020 Successful Micra AV leadless pacemaker implantation  DG Chest Port 1 View  Result Date: 05/09/2020 CLINICAL DATA:  Weakness, syncopal episode. EXAM: PORTABLE CHEST 1 VIEW COMPARISON:  None. FINDINGS: Borderline cardiomegaly. Pacer pad overlies the RIGHT chest coarse lung markings bilaterally. No pleural effusion or pneumothorax is seen. Osseous structures about the chest are unremarkable. IMPRESSION: 1. No active disease. No evidence of pneumonia or pulmonary edema. 2. Probable chronic interstitial lung disease and/or chronic bronchitic change. 3. Borderline cardiomegaly. Electronically Signed   By: 07/09/2020 M.D.   On: 05/09/2020 11:06     Echo pending  TELEMETRY: sinus rhythm, 86 bpm  ASSESSMENT AND PLAN:  Principal Problem:   Symptomatic bradycardia Active Problems:   Syncope and collapse   Elevated blood pressure reading   Second degree heart block     1.  Syncope, secondary to intermittent Mobitz 2 second-degree AV block, status post Micra AV leadless pacemaker on 05/10/2020 without apparent perioperative complications. The patient is currently in normal sinus rhythm at a rate of 86 bpm  Recommendations: 1. The patient may be discharged today from a cardiovascular perspective. 2. Postoperative site care discussed with the patient 3. Follow up with Dr. Darrold Junker in 1 week; appointment request already placed.   Leanora Ivanoff, PA-C 05/11/2020 8:47 AM

## 2020-05-11 NOTE — Discharge Summary (Signed)
Physician Discharge Summary  Tanner Howard [id number] DOB: 19-Jan-1950 DOA: 05/09/2020  PCP: Pcp, No  Admit date: 05/09/2020 Discharge date: 05/11/2020  Time spent: 45 minutes  Recommendations for Outpatient Follow-up:  1. Follow-up with Dr. Darrold Junker in 1 week.   Discharge Diagnoses:  Principal Problem:   Symptomatic bradycardia Active Problems:   Syncope and collapse   Elevated blood pressure reading   Second degree heart block   Discharge Condition: Stable and improved  Diet recommendation: Heart healthy  Filed Weights   05/10/20 0505 05/10/20 0708 05/11/20 0305  Weight: 78.9 kg 79.4 kg 78.1 kg    History of present illness:  HPI per Dr. Eleonore Chiquito is a 70 y.o. male with no significant past medical history presented to the ER for evaluation of a syncopal episode that happened 3 days prior to his admission.  Patient stated that he was working in his garden when he suddenly felt very dizzy and lightheaded.  He woke up and found himself on the ground.  He thinks he may have hit his head because he felt some soreness on his scalp for about 24 hours after.  He stated that since then he has had intermittent episodes of dizziness and lightheadedness and just walking across the room he feels like he is going to pass out.  His symptoms were associated with shortness of breath but he denied having any chest pain.. Patient stated that he has had symptoms intermittently since February of this year. He denied having any orthopnea, no lower extremity swelling, no nausea, no vomiting, no abdominal pain, no changes in his bowel habits, no headache, no blurred vision, no focal deficits, no urinary symptoms, no cough or fever, no chills. Labs show sodium 138, potassium 4.3, chloride 105, bicarb 23, glucose 174, BUN 16, creatinine 0.95, calcium 9.0, alkaline phosphatase 75, albumin 3.9, AST 27, ALT 40, total protein 7.1, troponin 17, white count 9.3, hemoglobin 14.9, hematocrit 44.6, MCV  95.3, RDW 12.1, platelet 233, PT 13.3, INR 1.0 Patient SARS coronavirus 2 point-of-care testing is still pending Chest x-ray reviewed by me shows no acute cardiopulmonary disease. Twelve-lead reviewed by me shows a type II second-degree heart block.    ED Course: Patient is a 70 year old male who presents to the ER for evaluation following a syncopal episode 3 days prior to his admission.  He continues to have intermittent episodes of dizziness and lightheadedness and is unable to ambulate across the room due to his symptoms.  Twelve-lead EKG showed second-degree heart block.  Patient has been seen by cardiology and is scheduled for pacemaker in a.m.  He will be admitted to the hospital for further evaluation.  Hospital Course:  1 symptomatic bradycardia/syncope secondary to Mobitz 2 second-degree AV block -Patient presented with syncopal episode dizziness and lightheadedness. -Patient noted on presentation and EKG to have a Mobitz 2 second-degree AV block. -Patient seen in consultation by cardiology and underwent Micra AV leadless pacemaker placement per Dr. Darrold Junker 05/10/2020. -2D echo and reading pending.  -Patient was seen by cardiology postop day 1 and cleared for discharge.   -Outpatient follow-up with cardiology in 1 week.    2.  Elevated blood pressure -Patient with no history of hypertension. -Not on antihypertensive medications. -Likely secondary to problem #1. -Evaded blood pressure resolved without any antihypertensive medications or further interventions beyond pacemaker placement.   -Patient was placed on hydralazine as needed.    Procedures:  Chest x-ray 05/09/2020  2D echo 05/10/2020  Status post Micra AV leadless pacemaker  per Dr. Darrold Junker 05/10/2020      Consultations:  Cardiology: Dr.Paraschos 05/09/2020    Discharge Exam: Vitals:   05/11/20 0305 05/11/20 0754  BP: (!) 143/98 117/87  Pulse: 80 80  Resp: 18   Temp: 98.4 F (36.9 C) 98.2 F (36.8 C)   SpO2: 94% 96%    General: NAD Cardiovascular: RRR Respiratory: CTAB  Discharge Instructions   Discharge Instructions    Diet - low sodium heart healthy   Complete by: As directed    Increase activity slowly   Complete by: As directed      Allergies as of 05/11/2020   No Known Allergies     Medication List    TAKE these medications   acetaminophen 325 MG tablet Commonly known as: TYLENOL Take 650 mg by mouth every 6 (six) hours as needed.   ibuprofen 200 MG tablet Commonly known as: ADVIL Take 200 mg by mouth every 6 (six) hours as needed.   multivitamin with minerals Tabs tablet Take 1 tablet by mouth daily.      No Known Allergies  Follow-up Information    Paraschos, Alexander, MD. Go in 1 week(s).   Specialty: Cardiology Contact information: [address] [facility] [place] [address] [phone]                The results of significant diagnostics from this hospitalization (including imaging, microbiology, ancillary and laboratory) are listed below for reference.    Significant Diagnostic Studies: EP PPM/ICD IMPLANT  Result Date: 05/10/2020 Successful Micra AV leadless pacemaker implantation  DG Chest Port 1 View  Result Date: 05/09/2020 CLINICAL DATA:  Weakness, syncopal episode. EXAM: PORTABLE CHEST 1 VIEW COMPARISON:  None. FINDINGS: Borderline cardiomegaly. Pacer pad overlies the RIGHT chest coarse lung markings bilaterally. No pleural effusion or pneumothorax is seen. Osseous structures about the chest are unremarkable. IMPRESSION: 1. No active disease. No evidence of pneumonia or pulmonary edema. 2. Probable chronic interstitial lung disease and/or chronic bronchitic change. 3. Borderline cardiomegaly. Electronically Signed   By: Bary Richard M.D.   On: 05/09/2020 11:06    Microbiology: Recent Results (from the past 240 hour(s))  SARS CORONAVIRUS 2 (TAT 6-24 HRS) Nasopharyngeal Nasopharyngeal Swab     Status:  None   Collection Time: 05/09/20 10:21 AM   Specimen: Nasopharyngeal Swab  Result Value Ref Range Status   SARS Coronavirus 2 NEGATIVE NEGATIVE Final    Comment: (NOTE) SARS-CoV-2 target nucleic acids are NOT DETECTED.  The SARS-CoV-2 RNA is generally detectable in upper and lower respiratory specimens during the acute phase of infection. Negative results do not preclude SARS-CoV-2 infection, do not rule out co-infections with other pathogens, and should not be used as the sole basis for treatment or other patient management decisions. Negative results must be combined with clinical observations, patient history, and epidemiological information. The expected result is Negative.  Fact Sheet for Patients: HairSlick.no  Fact Sheet for Healthcare Providers: quierodirigir.com  This test is not yet approved or cleared by the [place] FDA and  has been authorized for detection and/or diagnosis of SARS-CoV-2 by FDA under an Emergency Use Authorization (EUA). This EUA will remain  in effect (meaning this test can be used) for the duration of the COVID-19 declaration under Se ction 564(b)(1) of the Act, 21 U.S.C. section 360bbb-3(b)(1), unless the authorization is terminated or revoked sooner.  Performed at [facility], [address], [place], [place] [address]   Surgical PCR screen  Status: Abnormal   Collection Time: 05/09/20  7:32 PM   Specimen: Nasal Mucosa; Nasal Swab  Result Value Ref Range Status   MRSA, PCR NEGATIVE NEGATIVE Final   Staphylococcus aureus POSITIVE (A) NEGATIVE Final    Comment: (NOTE) The Xpert SA Assay (FDA approved for NASAL specimens in patients 58 years of age and older), is one component of a comprehensive surveillance program. It is not intended to diagnose infection nor to guide or monitor treatment. Performed at [facility], [address] [place], [place] [address]       Labs: Basic Metabolic Panel: Recent Labs  Lab 05/09/20 1009 05/10/20 0510 05/11/20 0554  NA 138 138 137  K 4.3 4.0 4.4  CL 105 104 104  CO2 23 25 24   GLUCOSE 174* 120* 110*  BUN 16 13 15   CREATININE 0.95 0.89 0.89  CALCIUM 9.0 8.8* 8.8*  MG  --   --  2.0   Liver Function Tests: Recent Labs  Lab 05/09/20 1009  AST 27  ALT 40  ALKPHOS 75  BILITOT 0.9  PROT 7.1  ALBUMIN 3.9   No results for input(s): LIPASE, AMYLASE in the last 168 hours. No results for input(s): AMMONIA in the last 168 hours. CBC: Recent Labs  Lab 05/09/20 1009 05/10/20 0510 05/11/20 0554  WBC 9.3 5.3 5.8  HGB 14.9 14.8 15.0  HCT 44.6 42.6 43.7  MCV 95.3 93.2 93.0  PLT 233 228 221   Cardiac Enzymes: No results for input(s): CKTOTAL, CKMB, CKMBINDEX, TROPONINI in the last 168 hours. BNP: BNP (last 3 results) No results for input(s): BNP in the last 8760 hours.  ProBNP (last 3 results) No results for input(s): PROBNP in the last 8760 hours.  CBG: Recent Labs  Lab 05/09/20 1020 05/11/20 0756  GLUCAP 158* 141*       Signed:  07/09/20 MD.  [facility] 05/11/2020, 10:23 AM

## 2020-05-11 NOTE — Progress Notes (Signed)
Tanner Howard to be D/C'd Home per MD order.  Discussed prescriptions and follow up appointments with the patient. NO Prescriptions given to patient, medication list explained in detail. Pt verbalized understanding.  Allergies as of 05/11/2020   No Known Allergies     Medication List    TAKE these medications   acetaminophen 325 MG tablet Commonly known as: TYLENOL Take 650 mg by mouth every 6 (six) hours as needed.   ibuprofen 200 MG tablet Commonly known as: ADVIL Take 200 mg by mouth every 6 (six) hours as needed.   multivitamin with minerals Tabs tablet Take 1 tablet by mouth daily.       Vitals:   05/11/20 0305 05/11/20 0754  BP: (!) 143/98 117/87  Pulse: 80 80  Resp: 18   Temp: 98.4 F (36.9 C) 98.2 F (36.8 C)  SpO2: 94% 96%    Skin clean, dry and intact without evidence of skin break down, no evidence of skin tears noted. IV catheter discontinued intact. Site without signs and symptoms of complications. Dressing and pressure applied. Pt denies pain at this time. No complaints noted.  An After Visit Summary was printed and given to the patient. Patient escorted via WC, and D/C home via private auto.  Tanner Howard

## 2020-05-11 NOTE — Progress Notes (Signed)
Pt ambulated around nursing station without chest pain, light headedness or dizziness. Discharge instructions explained/pt verbalized understanding. IV and tele removed. Will transport off unit via wheelchair.

## 2020-05-11 NOTE — Discharge Instructions (Signed)
You may shower tomorrow (Thursday), but try to avoid getting the bandage wet. Should it become wet, replace the bandage with the large band-aid I provided and keep a band-aid on it until you follow up in the office next week. Please avoid strenuous activities such as jogging, squatting, or lifting greater than 20 pounds for the next week. If you notice a tender, raised or deep, tender area of that insertion site, please call our office at [phone]. If the site bleeds apply pressure for 20 minutes. If the bleeding persists, please call our office.

## 2021-01-17 DIAGNOSIS — I10 Essential (primary) hypertension: Secondary | ICD-10-CM | POA: Diagnosis not present

## 2021-01-17 DIAGNOSIS — Z23 Encounter for immunization: Secondary | ICD-10-CM | POA: Diagnosis not present

## 2021-01-17 DIAGNOSIS — I441 Atrioventricular block, second degree: Secondary | ICD-10-CM | POA: Diagnosis not present

## 2021-04-12 ENCOUNTER — Other Ambulatory Visit
Admission: RE | Admit: 2021-04-12 | Discharge: 2021-04-12 | Disposition: A | Discharge status: Home / Self Care | Payer: No Typology Code available for payment source | Source: Ambulatory Visit | Attending: Physician Assistant | Admitting: Physician Assistant

## 2021-04-12 DIAGNOSIS — R001 Bradycardia, unspecified: Secondary | ICD-10-CM | POA: Diagnosis not present

## 2021-04-12 DIAGNOSIS — R0602 Shortness of breath: Secondary | ICD-10-CM | POA: Diagnosis not present

## 2021-04-12 DIAGNOSIS — R0609 Other forms of dyspnea: Secondary | ICD-10-CM | POA: Diagnosis not present

## 2021-04-12 DIAGNOSIS — I441 Atrioventricular block, second degree: Secondary | ICD-10-CM | POA: Diagnosis not present

## 2021-04-12 DIAGNOSIS — I10 Essential (primary) hypertension: Secondary | ICD-10-CM | POA: Diagnosis not present

## 2021-04-12 LAB — BRAIN NATRIURETIC PEPTIDE: B Natriuretic Peptide: 187.5 pg/mL — ABNORMAL HIGH (ref 0.0–100.0)

## 2021-04-13 DIAGNOSIS — I441 Atrioventricular block, second degree: Secondary | ICD-10-CM | POA: Diagnosis not present

## 2021-05-25 DIAGNOSIS — I441 Atrioventricular block, second degree: Secondary | ICD-10-CM | POA: Diagnosis not present

## 2021-07-21 DIAGNOSIS — I10 Essential (primary) hypertension: Secondary | ICD-10-CM | POA: Diagnosis not present

## 2021-07-21 DIAGNOSIS — I441 Atrioventricular block, second degree: Secondary | ICD-10-CM | POA: Diagnosis not present

## 2021-09-17 IMAGING — DX DG CHEST 1V PORT
1 series · 1 of 1 positions shown · non-contrast
Comparison: None.

CLINICAL DATA: Weakness, syncopal episode.

EXAM:
PORTABLE CHEST 1 VIEW

[chest ap]
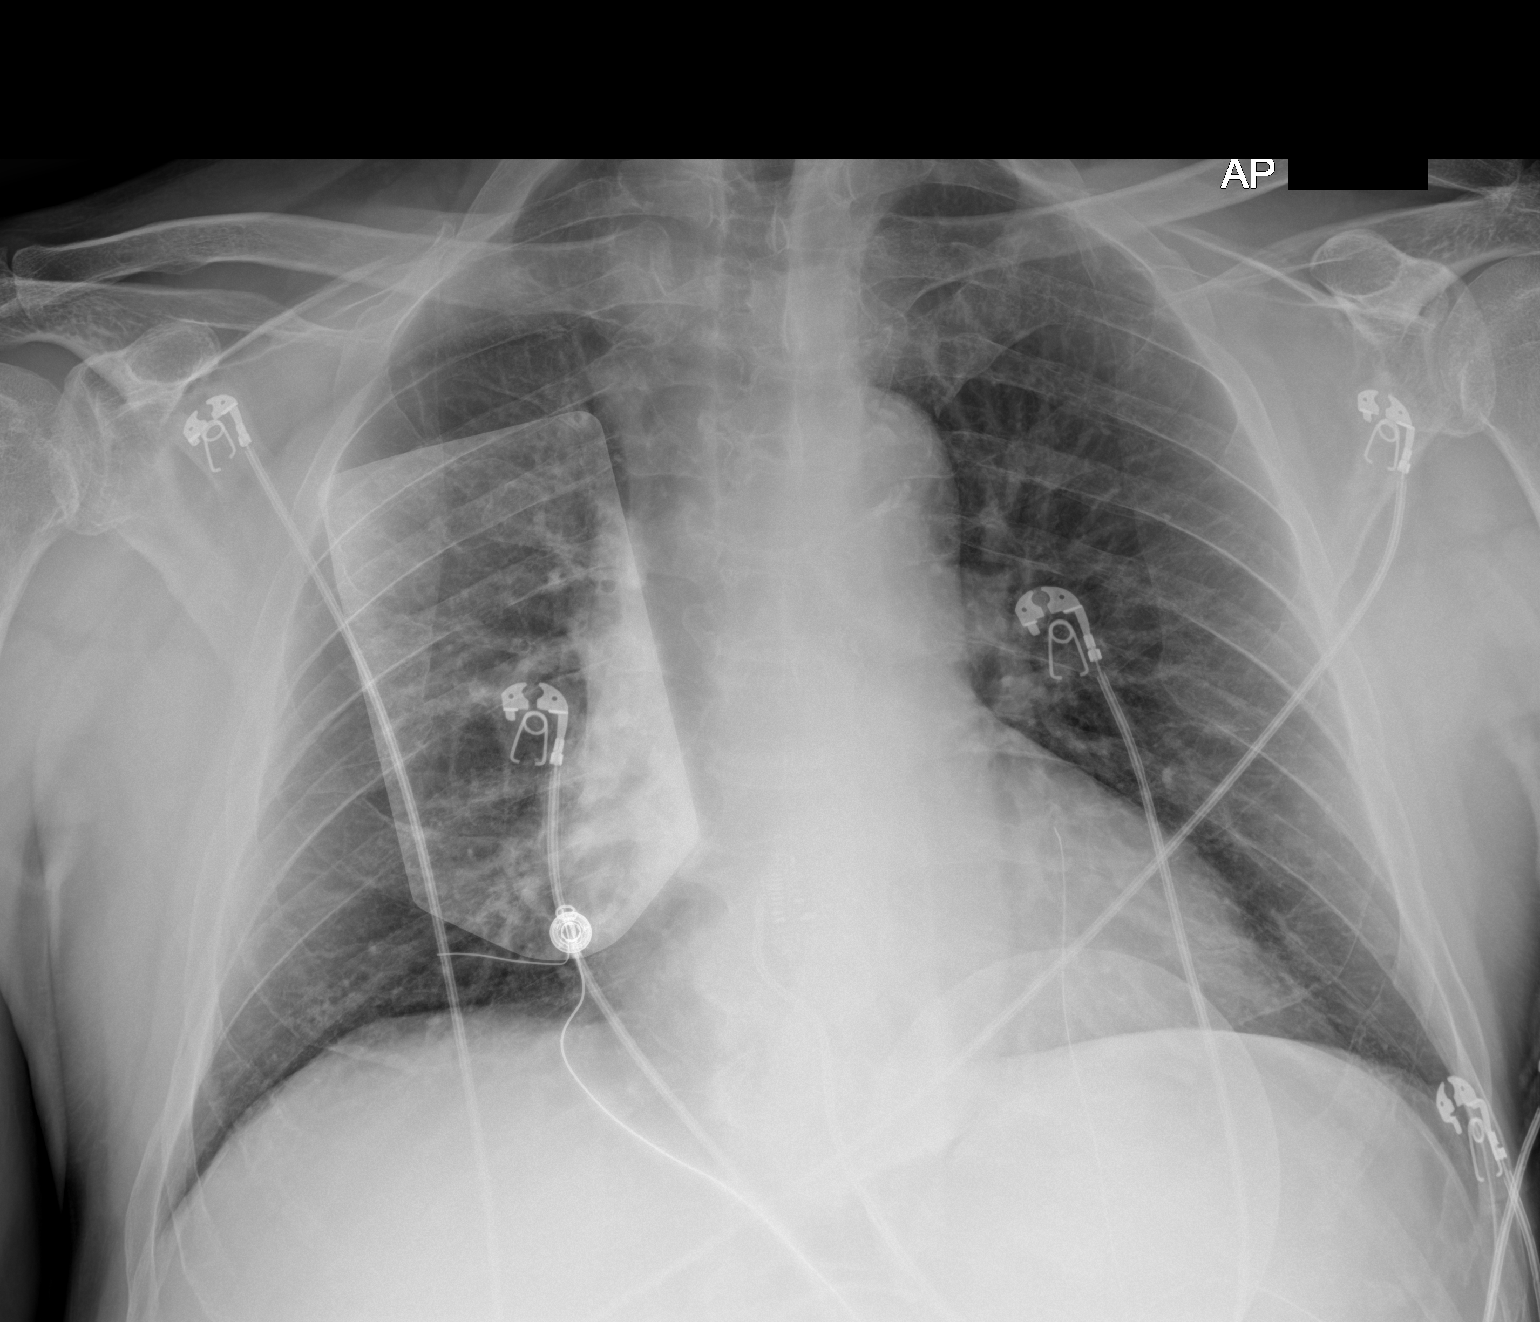

[1 of 1 positions shown; findings below may reference images not displayed]

FINDINGS: Borderline cardiomegaly. Pacer pad overlies the RIGHT chest coarse
lung markings bilaterally. No pleural effusion or pneumothorax is
seen. Osseous structures about the chest are unremarkable.
IMPRESSION: 1. No active disease. No evidence of pneumonia or pulmonary edema.
2. Probable chronic interstitial lung disease and/or chronic
bronchitic change.
3. Borderline cardiomegaly.

## 2021-11-15 DIAGNOSIS — I441 Atrioventricular block, second degree: Secondary | ICD-10-CM | POA: Diagnosis not present

## 2022-04-19 DIAGNOSIS — I441 Atrioventricular block, second degree: Secondary | ICD-10-CM | POA: Diagnosis not present

## 2023-03-12 DIAGNOSIS — L570 Actinic keratosis: Secondary | ICD-10-CM | POA: Diagnosis not present

## 2023-03-12 DIAGNOSIS — L280 Lichen simplex chronicus: Secondary | ICD-10-CM | POA: Diagnosis not present

## 2023-03-12 DIAGNOSIS — L821 Other seborrheic keratosis: Secondary | ICD-10-CM | POA: Diagnosis not present

## 2023-03-12 DIAGNOSIS — D2262 Melanocytic nevi of left upper limb, including shoulder: Secondary | ICD-10-CM | POA: Diagnosis not present

## 2023-03-12 DIAGNOSIS — D225 Melanocytic nevi of trunk: Secondary | ICD-10-CM | POA: Diagnosis not present

## 2023-03-12 DIAGNOSIS — D2261 Melanocytic nevi of right upper limb, including shoulder: Secondary | ICD-10-CM | POA: Diagnosis not present

## 2023-04-04 DIAGNOSIS — I441 Atrioventricular block, second degree: Secondary | ICD-10-CM | POA: Diagnosis not present

## 2023-04-27 DIAGNOSIS — Z008 Encounter for other general examination: Secondary | ICD-10-CM | POA: Diagnosis not present

## 2023-04-27 DIAGNOSIS — U071 COVID-19: Secondary | ICD-10-CM | POA: Diagnosis not present

## 2023-06-22 DIAGNOSIS — R7309 Other abnormal glucose: Secondary | ICD-10-CM | POA: Diagnosis not present

## 2023-06-22 DIAGNOSIS — I441 Atrioventricular block, second degree: Secondary | ICD-10-CM | POA: Diagnosis not present

## 2023-06-22 DIAGNOSIS — I10 Essential (primary) hypertension: Secondary | ICD-10-CM | POA: Diagnosis not present

## 2023-06-22 DIAGNOSIS — E7800 Pure hypercholesterolemia, unspecified: Secondary | ICD-10-CM | POA: Diagnosis not present

## 2023-06-22 DIAGNOSIS — Z950 Presence of cardiac pacemaker: Secondary | ICD-10-CM | POA: Diagnosis not present

## 2023-06-22 DIAGNOSIS — Z125 Encounter for screening for malignant neoplasm of prostate: Secondary | ICD-10-CM | POA: Diagnosis not present

## 2023-06-22 DIAGNOSIS — Z1331 Encounter for screening for depression: Secondary | ICD-10-CM | POA: Diagnosis not present

## 2023-06-22 DIAGNOSIS — Z0000 Encounter for general adult medical examination without abnormal findings: Secondary | ICD-10-CM | POA: Diagnosis not present

## 2023-09-25 DIAGNOSIS — I441 Atrioventricular block, second degree: Secondary | ICD-10-CM | POA: Diagnosis not present
